# Patient Record
Sex: Male | Born: 1965 | Race: White | Hispanic: No | Smoking: Current every day smoker
Health system: Southern US, Community
[De-identification: ages and names within clinical notes are randomized; demographics above are authoritative.]

## PROBLEM LIST (undated history)

## (undated) HISTORY — PX: LEG SURGERY: SHX1003

---

## 2000-05-03 ENCOUNTER — Emergency Department (HOSPITAL_COMMUNITY): Admission: EM | Admit: 2000-05-03 | Discharge: 2000-05-03 | Payer: Self-pay | Admitting: Emergency Medicine

## 2002-11-27 ENCOUNTER — Emergency Department (HOSPITAL_COMMUNITY): Admission: EM | Admit: 2002-11-27 | Discharge: 2002-11-27 | Payer: Self-pay | Admitting: Emergency Medicine

## 2007-04-05 ENCOUNTER — Emergency Department (HOSPITAL_COMMUNITY): Admission: EM | Admit: 2007-04-05 | Discharge: 2007-04-05 | Payer: Self-pay | Admitting: Internal Medicine

## 2009-07-31 ENCOUNTER — Emergency Department (HOSPITAL_COMMUNITY): Admission: EM | Admit: 2009-07-31 | Discharge: 2009-07-31 | Payer: Self-pay | Admitting: Emergency Medicine

## 2011-02-07 LAB — DIFFERENTIAL
Eosinophils Absolute: 0.2
Eosinophils Relative: 2
Lymphs Abs: 2.3
Monocytes Absolute: 0.7
Monocytes Relative: 7
Neutro Abs: 6.7
Neutrophils Relative %: 67

## 2011-02-07 LAB — I-STAT 8, (EC8 V) (CONVERTED LAB)
Acid-Base Excess: 3 — ABNORMAL HIGH
Glucose, Bld: 101 — ABNORMAL HIGH
HCT: 50
Operator id: 288831
pH, Ven: 7.531 — ABNORMAL HIGH

## 2011-02-07 LAB — CBC
Hemoglobin: 16.6
MCHC: 35

## 2011-02-07 LAB — POCT I-STAT CREATININE
Creatinine, Ser: 0.7
Operator id: 288831

## 2011-02-07 LAB — POCT CARDIAC MARKERS
Operator id: 288831
Troponin i, poc: <0.05

## 2013-07-26 ENCOUNTER — Emergency Department (HOSPITAL_COMMUNITY)
Admission: EM | Admit: 2013-07-26 | Discharge: 2013-07-26 | Disposition: A | Discharge status: Home / Self Care | Payer: Self-pay | Attending: Emergency Medicine | Admitting: Emergency Medicine

## 2013-07-26 ENCOUNTER — Encounter (HOSPITAL_COMMUNITY): Payer: Self-pay | Admitting: Emergency Medicine

## 2013-07-26 DIAGNOSIS — K047 Periapical abscess without sinus: Secondary | ICD-10-CM | POA: Insufficient documentation

## 2013-07-26 DIAGNOSIS — K029 Dental caries, unspecified: Secondary | ICD-10-CM | POA: Insufficient documentation

## 2013-07-26 DIAGNOSIS — F17200 Nicotine dependence, unspecified, uncomplicated: Secondary | ICD-10-CM | POA: Insufficient documentation

## 2013-07-26 MED ORDER — OXYCODONE-ACETAMINOPHEN 5-325 MG PO TABS: 1.0000 | ORAL_TABLET | ORAL | Status: DC | PRN

## 2013-07-26 MED ORDER — OXYCODONE-ACETAMINOPHEN 5-325 MG PO TABS
1.0000 | ORAL_TABLET | Freq: Once | ORAL | Status: AC
  Administered 2013-07-26: 1 via ORAL
  Filled 2013-07-26: qty 1

## 2013-07-26 MED ORDER — CLINDAMYCIN HCL 150 MG PO CAPS: 300.0000 mg | ORAL_CAPSULE | Freq: Four times a day (QID) | ORAL | Status: DC

## 2013-07-26 MED ORDER — ONDANSETRON 8 MG PO TBDP
8.0000 mg | ORAL_TABLET | Freq: Once | ORAL | Status: AC
  Administered 2013-07-26: 8 mg via ORAL
  Filled 2013-07-26: qty 1

## 2013-07-26 NOTE — ED Provider Notes (Signed)
CSN: [id number]     Arrival date & time 07/26/13  1052 History   First MD Initiated Contact with Patient 07/26/13 1106     Chief Complaint  Patient presents with  . Dental Pain     (Consider location/radiation/quality/duration/timing/severity/associated sxs/prior Treatment) Patient is a 48 y.o. male presenting with tooth pain. The history is provided by the patient.  Dental Pain Location:  Upper Upper teeth location:  16/LU 3rd molar Quality:  Throbbing and dull Severity:  Moderate Onset quality:  Gradual Timing:  Constant Progression:  Worsening Chronicity:  New Context: abscess, dental caries and poor dentition   Context: not trauma   Relieved by:  Nothing Worsened by:  Cold food/drink and hot food/drink Associated symptoms: facial pain, facial swelling and gum swelling   Associated symptoms: no congestion, no difficulty swallowing, no fever, no headaches, no neck pain, no neck swelling, no oral bleeding, no oral lesions and no trismus   Risk factors: lack of dental care, periodontal disease and smoking   Risk factors: no diabetes     History reviewed. No pertinent past medical history. Past Surgical History  Procedure Laterality Date  . Leg surgery     History reviewed. No pertinent family history. History  Substance Use Topics  . Smoking status: Current Every Day Smoker  . Smokeless tobacco: Current User    Types: Chew  . Alcohol Use: No    Review of Systems  Constitutional: Negative for fever and appetite change.  HENT: Positive for dental problem and facial swelling. Negative for congestion, mouth sores, sore throat and trouble swallowing.   Eyes: Negative for pain and visual disturbance.  Musculoskeletal: Negative for neck pain and neck stiffness.  Neurological: Negative for dizziness, facial asymmetry and headaches.  Hematological: Negative for adenopathy.  All other systems reviewed and are negative.      Allergies  Review of patient's allergies  indicates no known allergies.  Home Medications  No current outpatient prescriptions on file. BP 160/91  Pulse 101  Temp(Src) 98.5 F (36.9 C) (Oral)  Resp 20  Ht 5\' 8"  (1.727 m)  Wt 180 lb (81.647 kg)  BMI 27.38 kg/m2  SpO2 97% Physical Exam  Nursing note and vitals reviewed. Constitutional: He is oriented to person, place, and time. He appears well-developed and well-nourished. No distress.  HENT:  Head: Normocephalic and atraumatic.  Right Ear: Tympanic membrane and ear canal normal.  Left Ear: Tympanic membrane and ear canal normal.  Mouth/Throat: Uvula is midline, oropharynx is clear and moist and mucous membranes are normal. No trismus in the jaw. Dental caries present. No dental abscesses or uvula swelling.    Multiple dental caries with ttp of #16.  Mild to moderate facial edema without obvious dental abscess, trismus, or sublingual abnml.    Neck: Normal range of motion. Neck supple.  Cardiovascular: Normal rate, regular rhythm and normal heart sounds.   No murmur heard. Pulmonary/Chest: Effort normal and breath sounds normal.  Musculoskeletal: Normal range of motion.  Lymphadenopathy:    He has no cervical adenopathy.  Neurological: He is alert and oriented to person, place, and time. He exhibits normal muscle tone. Coordination normal.  Skin: Skin is warm and dry.    ED Course  Procedures (including critical care time) Labs Review Labs Reviewed - No data to display Imaging Review No results found.   EKG Interpretation None      MDM   Final diagnoses:  Dental abscess    Facial swelling w/o drainable dental abscess,  poor dental hygiene and multiple dental caries.  No trismus or infection to the floor of the mouth.  Pt given referral info for local dentists.  Agrees to arrange f/u.  VSS.  Pt is non-toxic appearing and stable for d/c  The patient appears reasonably screened and/or stabilized for discharge and I doubt any other medical condition or other  [facility] requiring further screening, evaluation, or treatment in the ED at this time prior to discharge.     Rain Wilhide L. Trisha Mangleriplett, PA-C 07/27/13 2154

## 2013-07-26 NOTE — Discharge Instructions (Signed)
°  Dental Abscess °A dental abscess is a collection of infected fluid (pus) from a bacterial infection in the inner part of the tooth (pulp). It usually occurs at the end of the tooth's root.  °CAUSES  °· Severe tooth decay. °· Trauma to the tooth that allows bacteria to enter into the pulp, such as a broken or chipped tooth. °SYMPTOMS  °· Severe pain in and around the infected tooth. °· Swelling and redness around the abscessed tooth or in the mouth or face. °· Tenderness. °· Pus drainage. °· Bad breath. °· Bitter taste in the mouth. °· Difficulty swallowing. °· Difficulty opening the mouth. °· Nausea. °· Vomiting. °· Chills. °· Swollen neck glands. °DIAGNOSIS  °· A medical and dental history will be taken. °· An examination will be performed by tapping on the abscessed tooth. °· X-rays may be taken of the tooth to identify the abscess. °TREATMENT °The goal of treatment is to eliminate the infection. You may be prescribed antibiotic medicine to stop the infection from spreading. A root canal may be performed to save the tooth. If the tooth cannot be saved, it may be pulled (extracted) and the abscess may be drained.  °HOME CARE INSTRUCTIONS °· Only take over-the-counter or prescription medicines for pain, fever, or discomfort as directed by your caregiver. °· Rinse your mouth (gargle) often with salt water (¼ tsp salt in 8 oz [250 ml] of warm water) to relieve pain or swelling. °· Do not drive after taking pain medicine (narcotics). °· Do not apply heat to the outside of your face. °· Return to your dentist for further treatment as directed. °SEEK MEDICAL CARE IF: °· Your pain is not helped by medicine. °· Your pain is getting worse instead of better. °SEEK IMMEDIATE MEDICAL CARE IF: °· You have a fever or persistent symptoms for more than 2 3 days. °· You have a fever and your symptoms suddenly get worse. °· You have chills or a very bad headache. °· You have problems breathing or swallowing. °· You have trouble  opening your mouth. °· You have swelling in the neck or around the eye. °Document Released: 04/18/2005 Document Revised: 01/11/2012 Document Reviewed: 07/27/2010 °[employer]® Patient Information ©2014 [employer]. ° ° °

## 2013-07-26 NOTE — ED Notes (Signed)
Swelling , pain lt lower jaw , Has broken tooth.

## 2013-07-28 NOTE — ED Provider Notes (Signed)
Medical screening examination/treatment/procedure(s) were performed by non-physician practitioner and as supervising physician I was immediately available for consultation/collaboration.   EKG Interpretation None        Akil Hoos M Kloey Cazarez, DO 07/28/13 1454 

## 2016-05-03 ENCOUNTER — Emergency Department (HOSPITAL_COMMUNITY)
Admission: EM | Admit: 2016-05-03 | Discharge: 2016-05-03 | Disposition: A | Discharge status: Home / Self Care | Payer: No Typology Code available for payment source | Attending: Emergency Medicine | Admitting: Emergency Medicine

## 2016-05-03 ENCOUNTER — Encounter (HOSPITAL_COMMUNITY): Payer: Self-pay

## 2016-05-03 ENCOUNTER — Emergency Department (HOSPITAL_COMMUNITY): Payer: No Typology Code available for payment source

## 2016-05-03 DIAGNOSIS — Y92410 Unspecified street and highway as the place of occurrence of the external cause: Secondary | ICD-10-CM | POA: Diagnosis not present

## 2016-05-03 DIAGNOSIS — M25572 Pain in left ankle and joints of left foot: Secondary | ICD-10-CM | POA: Diagnosis not present

## 2016-05-03 DIAGNOSIS — Y9389 Activity, other specified: Secondary | ICD-10-CM | POA: Diagnosis not present

## 2016-05-03 DIAGNOSIS — Z791 Long term (current) use of non-steroidal anti-inflammatories (NSAID): Secondary | ICD-10-CM | POA: Insufficient documentation

## 2016-05-03 DIAGNOSIS — Y999 Unspecified external cause status: Secondary | ICD-10-CM | POA: Diagnosis not present

## 2016-05-03 DIAGNOSIS — F17200 Nicotine dependence, unspecified, uncomplicated: Secondary | ICD-10-CM | POA: Diagnosis not present

## 2016-05-03 DIAGNOSIS — S99912A Unspecified injury of left ankle, initial encounter: Secondary | ICD-10-CM | POA: Diagnosis present
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
## 2016-05-03 NOTE — Discharge Instructions (Signed)
X-ray of ankle showed no fracture.  Ankle brace, ice, elevate, Tylenol or ibuprofen for pain
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
## 2017-06-03 IMAGING — CR DG ANKLE COMPLETE 3+V*L*
3 series · 3 of 3 positions shown · non-contrast
Comparison: None.

CLINICAL DATA: Left ankle pain, medial swelling.

EXAM:
LEFT ANKLE COMPLETE - 3+ VIEW

[x ankle lat left]
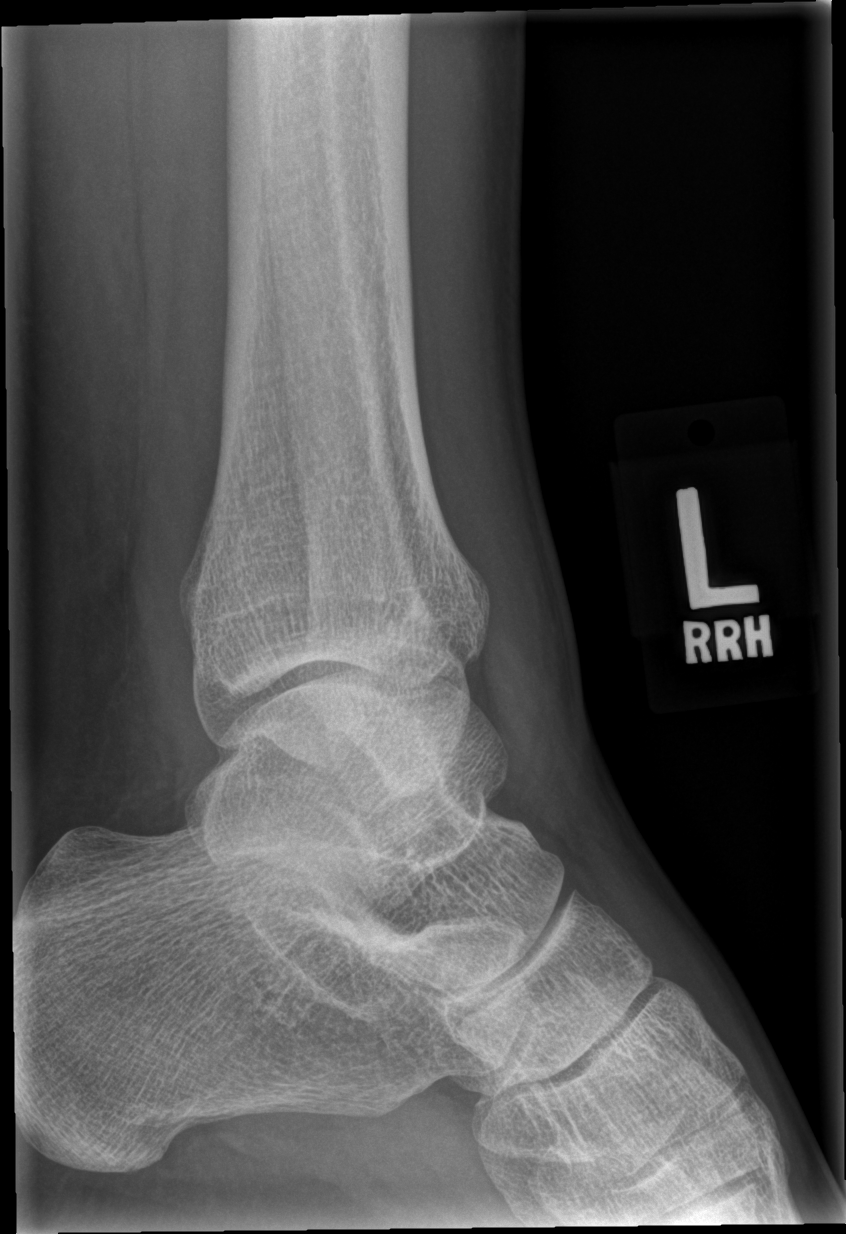

[x ankle ap left]
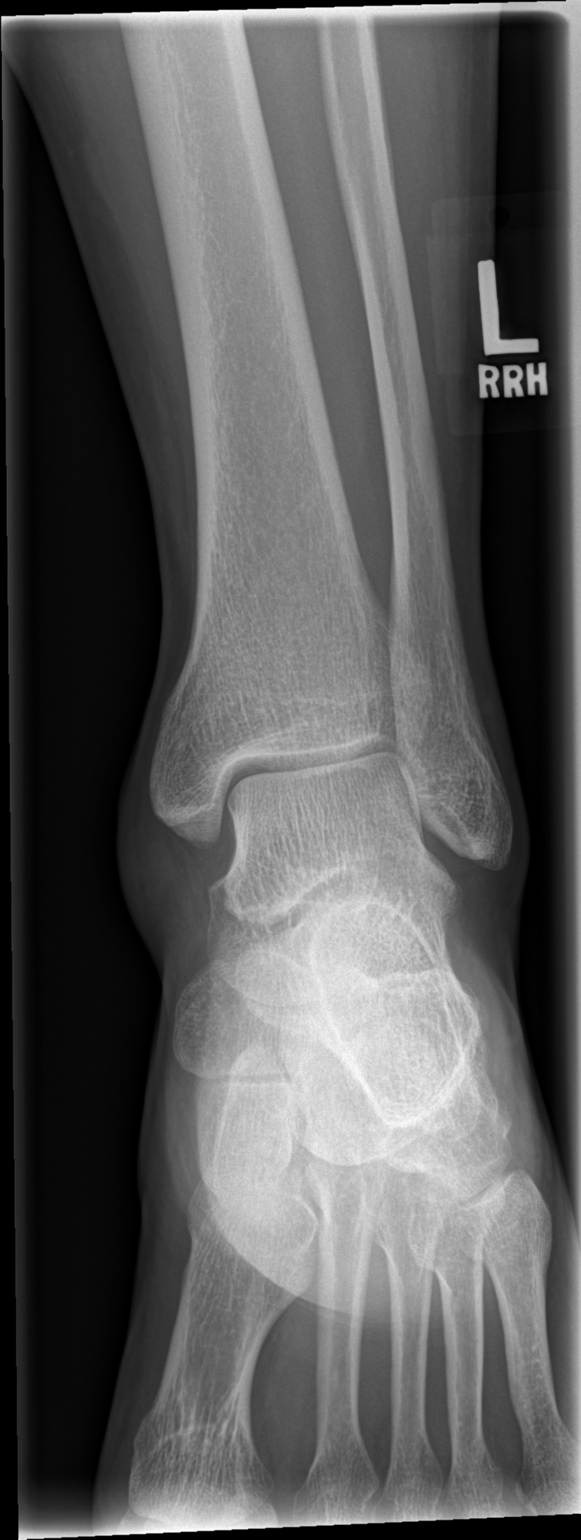

[x ankle obl left]
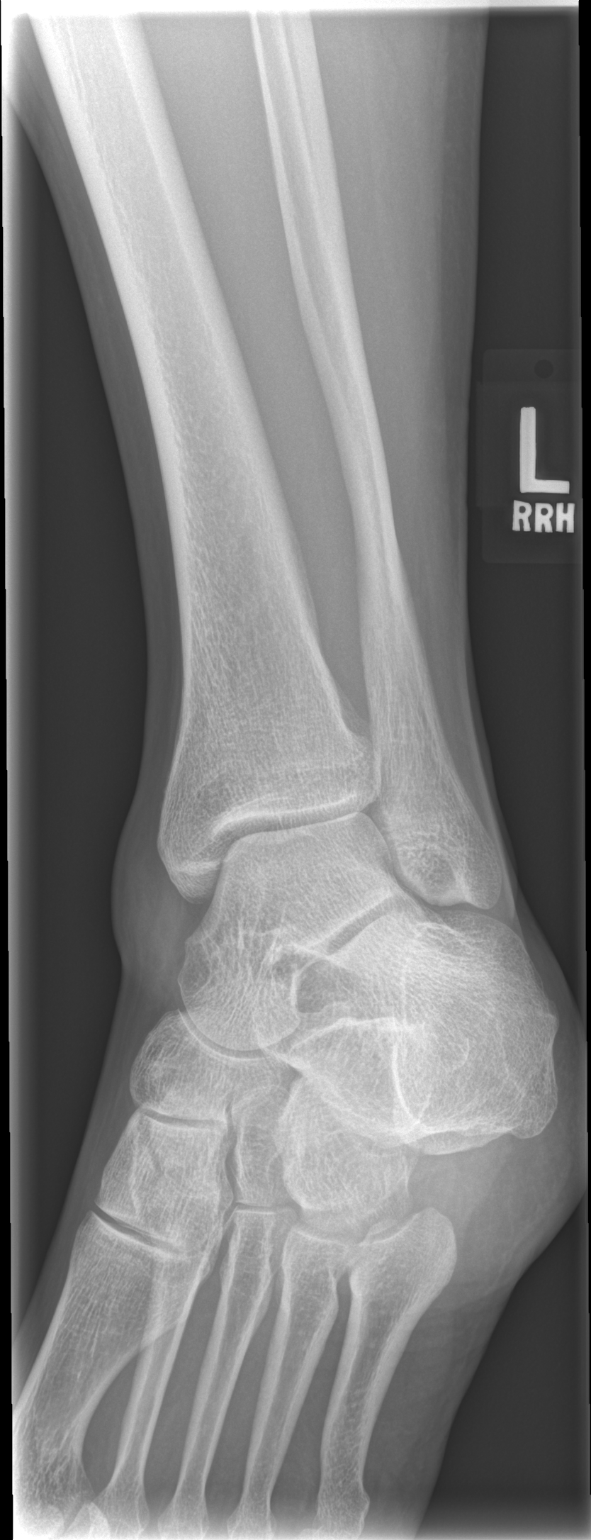

[3 of 3 positions shown; findings below may reference images not displayed]

FINDINGS: Anterior and medial soft tissue swelling. No underlying bony
abnormality. No fracture, subluxation or dislocation. Joint spaces
maintained.
IMPRESSION: No acute bony abnormality.
